# Patient Record
Sex: Male | Born: 2000 | Hispanic: No | Marital: Single
Health system: Southern US, Community
[De-identification: ages and names within clinical notes are randomized; demographics above are authoritative.]

---
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
## 2019-01-20 ENCOUNTER — Other Ambulatory Visit: Payer: Self-pay

## 2019-01-20 DIAGNOSIS — Z20822 Contact with and (suspected) exposure to covid-19: Secondary | ICD-10-CM

## 2019-01-24 LAB — NOVEL CORONAVIRUS, NAA: SARS-CoV-2, NAA: DETECTED — AB

## 2019-01-25 ENCOUNTER — Telehealth: Payer: Self-pay | Admitting: *Deleted

## 2019-01-25 ENCOUNTER — Telehealth: Payer: Self-pay | Admitting: Critical Care Medicine

## 2019-01-25 NOTE — Telephone Encounter (Signed)
Pt returned call for covid results; positive. Pt verbalizes understanding.  Pt states feels "Fine. Still can't smell but my taste is coming back." Reviewed self isolation; quarantine for 10 days since onset of symptoms, 3 consecutive days fever free. Home precautions,frequesnt hand washing and wiping of high touch surfaces with anti-bacterial wipes. Go out for medical issues only; if need to leave home for any other reason, wear mask. Pt verbalizes understanding.
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
## 2019-12-30 DIAGNOSIS — Z419 Encounter for procedure for purposes other than remedying health state, unspecified: Secondary | ICD-10-CM | POA: Diagnosis not present

## 2020-01-30 DIAGNOSIS — Z419 Encounter for procedure for purposes other than remedying health state, unspecified: Secondary | ICD-10-CM | POA: Diagnosis not present

## 2020-02-01 DIAGNOSIS — H5213 Myopia, bilateral: Secondary | ICD-10-CM | POA: Diagnosis not present

## 2020-02-01 DIAGNOSIS — H538 Other visual disturbances: Secondary | ICD-10-CM | POA: Diagnosis not present

## 2020-03-31 DIAGNOSIS — Z419 Encounter for procedure for purposes other than remedying health state, unspecified: Secondary | ICD-10-CM | POA: Diagnosis not present

## 2020-04-18 DIAGNOSIS — H5213 Myopia, bilateral: Secondary | ICD-10-CM | POA: Diagnosis not present

## 2020-05-01 DIAGNOSIS — Z419 Encounter for procedure for purposes other than remedying health state, unspecified: Secondary | ICD-10-CM | POA: Diagnosis not present

## 2020-05-31 DIAGNOSIS — Z419 Encounter for procedure for purposes other than remedying health state, unspecified: Secondary | ICD-10-CM | POA: Diagnosis not present

## 2020-06-29 DIAGNOSIS — H5213 Myopia, bilateral: Secondary | ICD-10-CM | POA: Diagnosis not present

## 2020-07-01 DIAGNOSIS — Z419 Encounter for procedure for purposes other than remedying health state, unspecified: Secondary | ICD-10-CM | POA: Diagnosis not present

## 2020-08-01 DIAGNOSIS — Z419 Encounter for procedure for purposes other than remedying health state, unspecified: Secondary | ICD-10-CM | POA: Diagnosis not present

## 2020-08-29 DIAGNOSIS — Z419 Encounter for procedure for purposes other than remedying health state, unspecified: Secondary | ICD-10-CM | POA: Diagnosis not present

## 2020-09-29 DIAGNOSIS — Z419 Encounter for procedure for purposes other than remedying health state, unspecified: Secondary | ICD-10-CM | POA: Diagnosis not present

## 2020-10-29 DIAGNOSIS — Z419 Encounter for procedure for purposes other than remedying health state, unspecified: Secondary | ICD-10-CM | POA: Diagnosis not present

## 2020-11-29 DIAGNOSIS — Z419 Encounter for procedure for purposes other than remedying health state, unspecified: Secondary | ICD-10-CM | POA: Diagnosis not present

## 2020-12-29 DIAGNOSIS — Z419 Encounter for procedure for purposes other than remedying health state, unspecified: Secondary | ICD-10-CM | POA: Diagnosis not present

## 2021-01-29 DIAGNOSIS — Z419 Encounter for procedure for purposes other than remedying health state, unspecified: Secondary | ICD-10-CM | POA: Diagnosis not present

## 2021-03-01 DIAGNOSIS — Z419 Encounter for procedure for purposes other than remedying health state, unspecified: Secondary | ICD-10-CM | POA: Diagnosis not present

## 2021-03-31 DIAGNOSIS — Z419 Encounter for procedure for purposes other than remedying health state, unspecified: Secondary | ICD-10-CM | POA: Diagnosis not present

## 2021-05-01 DIAGNOSIS — Z419 Encounter for procedure for purposes other than remedying health state, unspecified: Secondary | ICD-10-CM | POA: Diagnosis not present

## 2021-05-31 DIAGNOSIS — Z419 Encounter for procedure for purposes other than remedying health state, unspecified: Secondary | ICD-10-CM | POA: Diagnosis not present

## 2021-07-01 DIAGNOSIS — Z419 Encounter for procedure for purposes other than remedying health state, unspecified: Secondary | ICD-10-CM | POA: Diagnosis not present

## 2021-08-01 DIAGNOSIS — Z419 Encounter for procedure for purposes other than remedying health state, unspecified: Secondary | ICD-10-CM | POA: Diagnosis not present

## 2021-08-29 DIAGNOSIS — Z419 Encounter for procedure for purposes other than remedying health state, unspecified: Secondary | ICD-10-CM | POA: Diagnosis not present

## 2021-09-29 DIAGNOSIS — Z419 Encounter for procedure for purposes other than remedying health state, unspecified: Secondary | ICD-10-CM | POA: Diagnosis not present

## 2021-10-29 DIAGNOSIS — Z419 Encounter for procedure for purposes other than remedying health state, unspecified: Secondary | ICD-10-CM | POA: Diagnosis not present

## 2021-11-29 DIAGNOSIS — Z419 Encounter for procedure for purposes other than remedying health state, unspecified: Secondary | ICD-10-CM | POA: Diagnosis not present

## 2021-12-29 DIAGNOSIS — Z419 Encounter for procedure for purposes other than remedying health state, unspecified: Secondary | ICD-10-CM | POA: Diagnosis not present

## 2022-01-29 DIAGNOSIS — Z419 Encounter for procedure for purposes other than remedying health state, unspecified: Secondary | ICD-10-CM | POA: Diagnosis not present

## 2022-03-01 DIAGNOSIS — Z419 Encounter for procedure for purposes other than remedying health state, unspecified: Secondary | ICD-10-CM | POA: Diagnosis not present

## 2022-03-31 DIAGNOSIS — Z419 Encounter for procedure for purposes other than remedying health state, unspecified: Secondary | ICD-10-CM | POA: Diagnosis not present

## 2022-05-01 DIAGNOSIS — Z419 Encounter for procedure for purposes other than remedying health state, unspecified: Secondary | ICD-10-CM | POA: Diagnosis not present

## 2022-05-31 DIAGNOSIS — Z419 Encounter for procedure for purposes other than remedying health state, unspecified: Secondary | ICD-10-CM | POA: Diagnosis not present

## 2022-07-01 DIAGNOSIS — Z419 Encounter for procedure for purposes other than remedying health state, unspecified: Secondary | ICD-10-CM | POA: Diagnosis not present

## 2022-08-01 DIAGNOSIS — Z419 Encounter for procedure for purposes other than remedying health state, unspecified: Secondary | ICD-10-CM | POA: Diagnosis not present

## 2022-08-30 DIAGNOSIS — Z419 Encounter for procedure for purposes other than remedying health state, unspecified: Secondary | ICD-10-CM | POA: Diagnosis not present

## 2022-09-30 DIAGNOSIS — Z419 Encounter for procedure for purposes other than remedying health state, unspecified: Secondary | ICD-10-CM | POA: Diagnosis not present

## 2022-10-30 DIAGNOSIS — Z419 Encounter for procedure for purposes other than remedying health state, unspecified: Secondary | ICD-10-CM | POA: Diagnosis not present

## 2022-11-30 DIAGNOSIS — Z419 Encounter for procedure for purposes other than remedying health state, unspecified: Secondary | ICD-10-CM | POA: Diagnosis not present

## 2022-12-30 DIAGNOSIS — Z419 Encounter for procedure for purposes other than remedying health state, unspecified: Secondary | ICD-10-CM | POA: Diagnosis not present

## 2023-01-30 DIAGNOSIS — Z419 Encounter for procedure for purposes other than remedying health state, unspecified: Secondary | ICD-10-CM | POA: Diagnosis not present

## 2023-03-02 DIAGNOSIS — Z419 Encounter for procedure for purposes other than remedying health state, unspecified: Secondary | ICD-10-CM | POA: Diagnosis not present

## 2023-04-01 DIAGNOSIS — Z419 Encounter for procedure for purposes other than remedying health state, unspecified: Secondary | ICD-10-CM | POA: Diagnosis not present

## 2023-05-02 DIAGNOSIS — Z419 Encounter for procedure for purposes other than remedying health state, unspecified: Secondary | ICD-10-CM | POA: Diagnosis not present

## 2023-06-01 DIAGNOSIS — Z419 Encounter for procedure for purposes other than remedying health state, unspecified: Secondary | ICD-10-CM | POA: Diagnosis not present

## 2023-07-02 DIAGNOSIS — Z419 Encounter for procedure for purposes other than remedying health state, unspecified: Secondary | ICD-10-CM | POA: Diagnosis not present

## 2023-08-02 DIAGNOSIS — Z419 Encounter for procedure for purposes other than remedying health state, unspecified: Secondary | ICD-10-CM | POA: Diagnosis not present

## 2023-08-30 DIAGNOSIS — Z419 Encounter for procedure for purposes other than remedying health state, unspecified: Secondary | ICD-10-CM | POA: Diagnosis not present

## 2023-10-11 DIAGNOSIS — Z419 Encounter for procedure for purposes other than remedying health state, unspecified: Secondary | ICD-10-CM | POA: Diagnosis not present

## 2023-11-10 DIAGNOSIS — Z419 Encounter for procedure for purposes other than remedying health state, unspecified: Secondary | ICD-10-CM | POA: Diagnosis not present

## 2023-12-11 DIAGNOSIS — Z419 Encounter for procedure for purposes other than remedying health state, unspecified: Secondary | ICD-10-CM | POA: Diagnosis not present

## 2024-01-10 DIAGNOSIS — Z419 Encounter for procedure for purposes other than remedying health state, unspecified: Secondary | ICD-10-CM | POA: Diagnosis not present

## 2024-02-10 DIAGNOSIS — Z419 Encounter for procedure for purposes other than remedying health state, unspecified: Secondary | ICD-10-CM | POA: Diagnosis not present

## 2024-03-12 DIAGNOSIS — Z419 Encounter for procedure for purposes other than remedying health state, unspecified: Secondary | ICD-10-CM | POA: Diagnosis not present

## 2024-04-11 DIAGNOSIS — Z419 Encounter for procedure for purposes other than remedying health state, unspecified: Secondary | ICD-10-CM | POA: Diagnosis not present

## 2024-05-12 DIAGNOSIS — Z419 Encounter for procedure for purposes other than remedying health state, unspecified: Secondary | ICD-10-CM | POA: Diagnosis not present

## 2024-06-11 DIAGNOSIS — Z419 Encounter for procedure for purposes other than remedying health state, unspecified: Secondary | ICD-10-CM | POA: Diagnosis not present
